# Patient Record
Sex: Male | Born: 1995 | Race: Black or African American | Hispanic: No | Marital: Single | Smoking: Never smoker
Health system: Southern US, Community
[De-identification: ages and names within clinical notes are randomized; demographics above are authoritative.]

## PROBLEM LIST (undated history)

## (undated) DIAGNOSIS — S62339A Displaced fracture of neck of unspecified metacarpal bone, initial encounter for closed fracture: Secondary | ICD-10-CM

## (undated) HISTORY — PX: NO PAST SURGERIES: SHX2092

---
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
## 2014-10-20 ENCOUNTER — Encounter: Payer: Self-pay | Admitting: Family Medicine

## 2014-10-20 ENCOUNTER — Ambulatory Visit (INDEPENDENT_AMBULATORY_CARE_PROVIDER_SITE_OTHER): Payer: 59 | Admitting: Family Medicine

## 2014-10-20 VITALS — BP 118/64 | HR 60 | Temp 98.1°F | Resp 16 | Ht 70.5 in | Wt 135.8 lb

## 2014-10-20 DIAGNOSIS — Z113 Encounter for screening for infections with a predominantly sexual mode of transmission: Secondary | ICD-10-CM

## 2014-10-20 DIAGNOSIS — Z0000 Encounter for general adult medical examination without abnormal findings: Secondary | ICD-10-CM

## 2014-10-20 DIAGNOSIS — Z23 Encounter for immunization: Secondary | ICD-10-CM

## 2014-10-20 DIAGNOSIS — Z114 Encounter for screening for human immunodeficiency virus [HIV]: Secondary | ICD-10-CM

## 2014-10-20 LAB — POCT UA - MICROSCOPIC ONLY
Casts, Ur, LPF, POC: NEGATIVE
Crystals, Ur, HPF, POC: NEGATIVE
Mucus, UA: NEGATIVE
Yeast, UA: NEGATIVE

## 2014-10-20 LAB — POCT URINALYSIS DIPSTICK
Bilirubin, UA: NEGATIVE
Glucose, UA: NEGATIVE
Ketones, UA: NEGATIVE
Nitrite, UA: NEGATIVE
Protein, UA: NEGATIVE
Spec Grav, UA: 1.015
Urobilinogen, UA: 1
pH, UA: 7.5

## 2014-10-20 NOTE — Progress Notes (Signed)
IDENTIFYING INFORMATION  Johnny Jacobs Doxtater / DOB: 06/17/1996 / MRN: [id number]  The patient has RHINITIS, ALLERGIC on his problem list.  SUBJECTIVE  Chief Complaint: Annual Exam   History of present illness: Johnny Jacobs is a 18 y.o. year old male who presents for an annual exam.  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 He is asymptomatic today.    He complains of sleep difficulties and gets 6-7 hours of sleep every 24 hours, however his sleep is broken up.  He denies anhedonia and depressed mood.    He reports that he has started counseling for the stressors that accompany school.      HIV: He has not been screened.  He reports being sexually active.  He prefers women.  He reports that is has been "a while" since he was sexually active.  He reports that he does not always use protection.  He has had 3 partners in the past.  He would like an HIV and syphilis screening today.  He denies urinary symptoms.  He smokes marijuana, and is in the process of quitting.     He  has a past medical history of Allergy and Depression.    He currently has no medications in their medication list.  Johnny Jacobs has No Known Allergies. He  reports that he has never smoked. He does not have any smokeless tobacco history on file. He reports that he drinks alcohol. He reports that he uses illicit drugs about twice per week.  The patient  has no past surgical history on file.  His family history includes Diabetes in his maternal grandmother; Hyperlipidemia in his maternal grandmother; Hypertension in his father and maternal grandmother.  Review of Systems  Constitutional: Negative.   HENT: Negative.   Eyes: Negative.   Respiratory: Negative.   Cardiovascular: Negative.   Gastrointestinal: Negative.   Genitourinary: Negative.   Musculoskeletal: Negative.   Skin: Negative.   Neurological: Negative.   Endo/Heme/Allergies: Negative.   Psychiatric/Behavioral: Negative.      OBJECTIVE  Blood pressure 118/64, pulse 60, temperature 98.1 F (36.7 C), temperature source Oral, resp. rate 16, height 5' 10.5" (1.791 m), weight 135 lb 12.8 oz (61.598 kg), SpO2 99 %. The patient's body mass index is 19.2 kg/(m^2).  Physical Exam  Constitutional: He is oriented to person, place, and time. He appears well-developed and well-nourished.  HENT:  Head: Normocephalic.  Mouth/Throat: No oropharyngeal exudate.  Eyes: Conjunctivae and EOM are normal. Pupils are equal, round, and reactive to light.  Neck: Normal range of motion. No tracheal deviation present.  Cardiovascular: Normal rate, regular rhythm and normal heart sounds.  Exam reveals no gallop and no friction rub.   No murmur heard. Respiratory: Effort normal and breath sounds normal.  GI: Soft. Bowel sounds are normal. Hernia confirmed negative in the right inguinal area and confirmed negative in the left inguinal area.  Genitourinary: Testes normal and penis normal. Right testis shows no mass, no swelling and no tenderness. Left testis shows no mass, no swelling and no tenderness. Circumcised. No hypospadias, penile erythema or penile tenderness. No discharge found.  Musculoskeletal: Normal range of motion.  Lymphadenopathy:    He has no cervical adenopathy.       Right: No inguinal adenopathy present.       Left: No inguinal adenopathy present.  Neurological: He is alert and oriented to person, place, and time. He has normal strength and normal reflexes. No cranial nerve deficit.  Skin: Skin is warm, dry and intact.  Psychiatric: He has a normal mood and affect. His behavior is normal. Judgment and thought content normal.    Results for orders placed or performed in visit on 10/20/14 (from the past 24 hour(s))  POCT UA - Microscopic Only     Status: Abnormal   Collection Time: 10/20/14  3:47 PM  Result Value Ref Range   WBC, Ur, HPF, POC 3-5    RBC, urine, microscopic 2-3    Bacteria, U Microscopic trace     Mucus, UA neg    Epithelial cells, urine per micros 0-1    Crystals, Ur, HPF, POC neg    Casts, Ur, LPF, POC neg    Yeast, UA neg   POCT urinalysis dipstick     Status: Abnormal   Collection Time: 10/20/14  3:47 PM  Result Value Ref Range   Color, UA yellow    Clarity, UA clear    Glucose, UA neg    Bilirubin, UA neg    Ketones, UA neg    Spec Grav, UA 1.015    Blood, UA trace    pH, UA 7.5    Protein, UA neg    Urobilinogen, UA 1.0    Nitrite, UA neg    Leukocytes, UA Trace     ASSESSMENT & PLAN  Rasaan was seen today for annual exam.  Diagnoses and associated orders for this visit:  Annual physical exam - POCT UA - Microscopic Only - POCT urinalysis dipstick  Need for prophylactic vaccination and inoculation against influenza - Flu Vaccine QUAD 36+ mos IM  Screening for STD (sexually transmitted disease) - RPR - GC/Chlamydia Probe Amp  Screening for HIV (human immunodeficiency virus) - HIV antibody     The patient was instructed to to call or comeback to clinic as needed, or should symptoms warrant.  Deliah BostonMichael Maniyah Moller, MHS, PA-C [facility] 10/20/2014 5:21 PM

## 2014-10-21 ENCOUNTER — Telehealth: Payer: Self-pay | Admitting: Physician Assistant

## 2014-10-21 ENCOUNTER — Encounter: Payer: Self-pay | Admitting: Physician Assistant

## 2014-10-21 DIAGNOSIS — A5601 Chlamydial cystitis and urethritis: Secondary | ICD-10-CM

## 2014-10-21 LAB — RPR

## 2014-10-21 LAB — HIV ANTIBODY (ROUTINE TESTING W REFLEX): HIV 1&2 Ab, 4th Generation: NONREACTIVE

## 2014-10-21 LAB — GC/CHLAMYDIA PROBE AMP
CT Probe RNA: POSITIVE — AB
GC Probe RNA: NEGATIVE

## 2014-10-21 MED ORDER — AZITHROMYCIN 500 MG PO TABS: 1000.0000 mg | ORAL_TABLET | Freq: Every day | ORAL | Status: AC

## 2014-10-21 NOTE — Telephone Encounter (Signed)
I have spoken with patient regarding the his negative HIV, Syphilis, and Gonorrhea results.  Informed him of positive Chlamydia, and that he is to take 1 gram of Azithromycin today.  Medication sent to pharmacy.  Information was received well be the patient.  Retest with with urine probe in one month and that lab is ordered. Patient satisfied with plan.      Deliah BostonMichael Clark, MS, PA-C   12:54 PM, 10/21/2014

## 2014-10-24 NOTE — Progress Notes (Signed)
Agree with A/P. Tle

## 2016-01-17 DIAGNOSIS — M545 Low back pain: Secondary | ICD-10-CM | POA: Diagnosis not present

## 2016-01-30 ENCOUNTER — Ambulatory Visit (INDEPENDENT_AMBULATORY_CARE_PROVIDER_SITE_OTHER): Payer: 59 | Admitting: Family Medicine

## 2016-01-30 VITALS — BP 120/77 | HR 57 | Temp 98.1°F | Resp 18 | Ht 71.0 in | Wt 159.0 lb

## 2016-01-30 DIAGNOSIS — S39012A Strain of muscle, fascia and tendon of lower back, initial encounter: Secondary | ICD-10-CM | POA: Diagnosis not present

## 2016-01-30 DIAGNOSIS — Z8619 Personal history of other infectious and parasitic diseases: Secondary | ICD-10-CM

## 2016-01-30 NOTE — Patient Instructions (Signed)
Do gentle range of motion twice a day May take OTC ibuprofen 2 tabs every 8 hours for pain May apply heat as needed Please return to clinic for worsening pain and lost of bowel or bladder control.  Lumbosacral Strain Lumbosacral strain is a strain of any of the parts that make up your lumbosacral vertebrae. Your lumbosacral vertebrae are the bones that make up the lower third of your backbone. Your lumbosacral vertebrae are held together by muscles and tough, fibrous tissue (ligaments).  CAUSES  A sudden blow to your back can cause lumbosacral strain. Also, anything that causes an excessive stretch of the muscles in the low back can cause this strain. This is typically seen when people exert themselves strenuously, fall, lift heavy objects, bend, or crouch repeatedly. RISK FACTORS  Physically demanding work.  Participation in pushing or pulling sports or sports that require a sudden twist of the back (tennis, golf, baseball).  Weight lifting.  Excessive lower back curvature.  Forward-tilted pelvis.  Weak back or abdominal muscles or both.  Tight hamstrings. SIGNS AND SYMPTOMS  Lumbosacral strain may cause pain in the area of your injury or pain that moves (radiates) down your leg.  DIAGNOSIS Your health care provider can often diagnose lumbosacral strain through a physical exam. In some cases, you may need tests such as X-ray exams.  TREATMENT  Treatment for your lower back injury depends on many factors that your clinician will have to evaluate. However, most treatment will include the use of anti-inflammatory medicines. HOME CARE INSTRUCTIONS   Avoid hard physical activities (tennis, racquetball, waterskiing) if you are not in proper physical condition for it. This may aggravate or create problems.  If you have a back problem, avoid sports requiring sudden body movements. Swimming and walking are generally safer activities.  Maintain good posture.  Maintain a healthy  weight.  For acute conditions, you may put ice on the injured area.  Put ice in a plastic bag.  Place a towel between your skin and the bag.  Leave the ice on for 20 minutes, 2-3 times a day.  When the low back starts healing, stretching and strengthening exercises may be recommended. SEEK MEDICAL CARE IF:  Your back pain is getting worse.  You experience severe back pain not relieved with medicines. SEEK IMMEDIATE MEDICAL CARE IF:   You have numbness, tingling, weakness, or problems with the use of your arms or legs.  There is a change in bowel or bladder control.  You have increasing pain in any area of the body, including your belly (abdomen).  You notice shortness of breath, dizziness, or feel faint.  You feel sick to your stomach (nauseous), are throwing up (vomiting), or become sweaty.  You notice discoloration of your toes or legs, or your feet get very cold. MAKE SURE YOU:   Understand these instructions.  Will watch your condition.  Will get help right away if you are not doing well or get worse.   This information is not intended to replace advice given to you by your health care provider. Make sure you discuss any questions you have with your health care provider.   Document Released: 08/27/2005 Document Revised: 12/08/2014 Document Reviewed: 07/06/2013 Elsevier Interactive Patient Education [employer]

## 2016-01-30 NOTE — Progress Notes (Signed)
Subjective:    Patient ID: Johnny Jacobs, male    DOB: 07/13/1996, 20 y.o.   MRN: [id number]  HPI This is pleasant 20 yo male who presents today with back pain. 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 He was given a muscle relaxer (?cyclobenzaprine) with relief of pain. He was stretching and doing well until he over stretched when reaching for something last week and felt a pain is his back. This has been improved with muscle relaxer and he is having rare intermittent pain now. He just wanted to come in today to make sure nothing was wrong. Does not exercise regularly. Has been considering starting yoga.   He has history of positive chlamydia 10/20/14. He never had follow up testing. He declines testing today. He denies urinary symptoms, discharge/rash.   Past Medical History  Diagnosis Date  . Allergy     seasonal  . Depression     per patient college depression  . Chlamydia infection 10/21/2014    Treated with one gram of azithromycin  History reviewed. No pertinent past surgical history. Family History  Problem Relation Age of Onset  . Hypertension Father   . Diabetes Maternal Grandmother   . Hyperlipidemia Maternal Grandmother   . Hypertension Maternal Grandmother    Social History  Substance Use Topics  . Smoking status: Never Smoker   . Smokeless tobacco: None  . Alcohol Use: 0.0 oz/week    0 Standard drinks or equivalent per week     Comment: occasionally      Review of Systems  Constitutional: Negative for fever and fatigue.  Respiratory: Negative for cough and shortness of breath.   Cardiovascular: Negative for chest pain.  Gastrointestinal: Negative for abdominal pain.  Genitourinary: Negative for dysuria, frequency, discharge and genital sores.  Musculoskeletal: Positive for back pain (low back). Negative for neck pain.  Neurological: Negative for weakness, numbness and headaches.       Objective:   Physical Exam  Constitutional: He is oriented to person, place, and time. He appears well-developed and well-nourished.  HENT:  Head: Normocephalic and atraumatic.  Eyes: Conjunctivae and EOM are normal. Pupils are equal, round, and reactive to light.  Neck: Normal range of motion. Neck supple.  Cardiovascular: Normal rate, regular rhythm and normal heart sounds.   Pulmonary/Chest: Effort normal and breath sounds normal.  Musculoskeletal: Normal range of motion.       Cervical back: Normal.       Thoracic back: Normal.       Lumbar back: Normal.  Neurological: He is alert and oriented to person, place, and time. He has normal reflexes.  Skin: Skin is warm and dry.  Psychiatric: He has a normal mood and affect. His behavior is normal. Judgment and thought content normal.  Vitals reviewed.     BP 120/77 mmHg  Pulse 57  Temp(Src) 98.1 F (36.7 C) (Oral)  Resp 18  Ht  (1.803 m)  Wt 159 lb (72.122 kg)  BMI 22.19 kg/m2  SpO2 94% Wt Readings from Last 3 Encounters:  01/30/16 159 lb (72.122 kg)  10/20/14 135 lb 12.8 oz (61.598 kg)  07/16/12 141 lb 6.4 oz (64.139 kg) (37 %*, Z = -0.34)   * Growth percentiles are based on CDC 2-20 Years data.       Assessment & Plan:  1. Lumbar strain, initial encounter -Provided written and verbal information regarding diagnosis and treatment. -  Encouraged stretching, heat, ROM - RTC precautions reviewed  2. History of chlamydia infection - strongly encouraged screening today, patient declined, states he will return for STD testing   Olean Ree, FNP-BC  [facility]  02/01/2016 9:26 AM

## 2016-01-30 NOTE — Progress Notes (Signed)
   Subjective:    Patient ID: Johnny Jacobs, male    DOB: Nov 20, 1996, 20 y.o.   MRN: [id number]  HPI This is a pleasant 20 year old that presents today with sharpe lower back pain x 2 weeks. Patient reached down to pick up a box and pulled his back. He has seen the doctor at [facility] who prescribed some muscle relaxers- patient has had some relief. Patient reports that he was sitting on the bed this week and reached for something and felt a pain that was worst then the first time (10/10)- pt took a muscle relaxer after this event with relief. Concerned today that he has injured something.   Past Medical History  Diagnosis Date  . Allergy     seasonal  . Depression     per patient college depression  . Chlamydia infection 10/21/2014    Treated with one gram of azithromycin   Family History  Problem Relation Age of Onset  . Hypertension Father   . Diabetes Maternal Grandmother   . Hyperlipidemia Maternal Grandmother   . Hypertension Maternal Grandmother    Social History   Social History  . Marital Status: Single    Spouse Name: N/A  . Number of Children: 0  . Years of Education: 16   Occupational History  . Student [employer]   Social History Main Topics  . Smoking status: Never Smoker   . Smokeless tobacco: Not on file  . Alcohol Use: 0.0 oz/week    0 Standard drinks or equivalent per week     Comment: occasionally  . Drug Use: 2.00 per week     Comment: marijuana  . Sexual Activity:    Partners: Female     Comment: 3 partners as of 10/21/2014   Other Topics Concern  . Not on file   Social History Narrative    Review of Systems  Constitutional: Negative for fever, activity change and appetite change.  HENT: Negative for congestion.   Respiratory: Negative for cough, chest tightness and shortness of breath.   Cardiovascular: Negative for chest pain.  Gastrointestinal: Negative for abdominal pain.  Musculoskeletal: Positive for back pain (x 2  weeks).  Neurological: Negative for dizziness.       Objective:   Physical Exam  Constitutional: He is oriented to person, place, and time. He appears well-developed and well-nourished.  HENT:  Head: Normocephalic.  Neck: Normal range of motion.  Cardiovascular: Normal rate, regular rhythm and normal heart sounds.   Pulmonary/Chest: Effort normal and breath sounds normal. No respiratory distress. He has no wheezes. He exhibits no tenderness.  Musculoskeletal: Normal range of motion.  Neurological: He is alert and oriented to person, place, and time.  Skin: Skin is warm and dry.  Psychiatric: He has a normal mood and affect. His behavior is normal. Judgment and thought content normal.     BP 120/77 mmHg  Pulse 57  Temp(Src) 98.1 F (36.7 C) (Oral)  Resp 18  Ht  (1.803 m)  Wt 159 lb (72.122 kg)  BMI 22.19 kg/m2  SpO2 94%      Assessment & Plan:

## 2016-02-01 ENCOUNTER — Encounter: Payer: Self-pay | Admitting: Family Medicine

## 2017-03-01 DIAGNOSIS — S62339A Displaced fracture of neck of unspecified metacarpal bone, initial encounter for closed fracture: Secondary | ICD-10-CM

## 2017-03-01 HISTORY — DX: Displaced fracture of neck of unspecified metacarpal bone, initial encounter for closed fracture: S62.339A

## 2017-03-02 ENCOUNTER — Ambulatory Visit (INDEPENDENT_AMBULATORY_CARE_PROVIDER_SITE_OTHER): Payer: 59

## 2017-03-02 ENCOUNTER — Ambulatory Visit (INDEPENDENT_AMBULATORY_CARE_PROVIDER_SITE_OTHER): Payer: 59 | Admitting: Physician Assistant

## 2017-03-02 VITALS — BP 142/92 | HR 64 | Temp 99.0°F | Resp 16 | Ht 71.0 in | Wt 165.0 lb

## 2017-03-02 DIAGNOSIS — M79631 Pain in right forearm: Secondary | ICD-10-CM

## 2017-03-02 DIAGNOSIS — M25531 Pain in right wrist: Secondary | ICD-10-CM | POA: Diagnosis not present

## 2017-03-02 DIAGNOSIS — S62306A Unspecified fracture of fifth metacarpal bone, right hand, initial encounter for closed fracture: Secondary | ICD-10-CM | POA: Diagnosis not present

## 2017-03-02 DIAGNOSIS — M79641 Pain in right hand: Secondary | ICD-10-CM | POA: Diagnosis not present

## 2017-03-02 DIAGNOSIS — S62336A Displaced fracture of neck of fifth metacarpal bone, right hand, initial encounter for closed fracture: Secondary | ICD-10-CM | POA: Diagnosis not present

## 2017-03-02 MED ORDER — HYDROCODONE-ACETAMINOPHEN 5-325 MG PO TABS: 1.0000 | ORAL_TABLET | Freq: Three times a day (TID) | ORAL | 0 refills | Status: DC | PRN

## 2017-03-02 NOTE — Patient Instructions (Addendum)
Keep arm immobilized until you see hand surgery. They should call you in the next couple of days for your appointment. If you do not hear from ortho in two days, please contact our office immediately.  When you shower, use a plastic bag to keep the splint dry.   For pain, you may take  of tylenol with ibuprofen 400-600mg  every 6 hours for pain and inflammation. For breakthrough pain, use hydrocodone 1 tablet every 8 hours as needed.     Boxer's Fracture A boxer's fracture is a break (fracture) of the bone in your hand that connects your little finger to your wrist (fifth metacarpal). This type of fracture usually happens at the end of the bone, closest to the little finger. The knuckle is often pushed down by the impact. In some cases, only a splint or brace is needed, or you may need a cast. Casting or splinting may include taping your injured finger to the next finger (buddy taping). You may need surgery to repair the fracture. This may involve the use of wires, screws, or plates to hold the bone pieces in place. What are the causes? This injury may be caused by:  Hitting an object with a clenched fist.  A hard, direct hit to the hand.  An injury that crushes the hand. What increases the risk? This injury is more likely to occur if:  You are in a fistfight.  You have certain bone diseases. What are the signs or symptoms? Symptoms of this type of fracture develop soon after the injury. Symptoms may include:  Swelling of the hand.  Pain.  Pain when moving the fifth finger or touching the hand.  Abnormal position of the finger.  Not being able to move the finger.  A shortened finger.  A finger knuckle that looks sunken in. How is this diagnosed? This injury may be diagnosed based on your symptoms, especially if you had a recent hand injury. Your health care provider will perform a physical exam, and you may also have X-rays to confirm the diagnosis. How is this  treated? Treatment for this injury depends on how severe it is. Possible treatments include:  Closed reduction. If your bone is stable and can be moved back into place, you may only need to wear a cast or splint or have buddy taping.  Open reduction with internal fixation (ORIF). This may be needed if your fracture is far out of place or goes through the joint surface of the bone. This treatment involves open surgery to move your bones back into the right position. Screws, wires, or plates may be used to stabilize the fracture. You may need to wear a cast or a splint for several weeks. You will also need to have follow-up X-rays to make sure that the bone is healing well and staying in position. After you no longer need the cast or splint, you may need physical therapy. This will help you to regain full movement and strength in your hand. Follow these instructions at home: If you have a cast:   Do not stick anything inside the cast to scratch your skin. Doing that increases your risk of infection.  Check the skin around the cast every day. Report any concerns to your health care provider. You may put lotion on dry skin around the edges of the cast. Do not apply lotion to the skin underneath the cast. If you have a splint:   Wear it as directed by your health care provider.  Remove it only as directed by your health care provider.  Loosen the splint if your fingers become numb and tingle, or if they turn cold and blue. Bathing   Cover the cast or splint with a watertight plastic bag to protect it from water while you take a bath or a shower. Do not let the cast or splint get wet. Managing pain, stiffness, and swelling   If directed, apply ice to the injured area (if you have a splint, not a cast):  Put ice in a plastic bag.  Place a towel between your skin and the bag.  Leave the ice on for 20 minutes, 2-3 times a day.  Move your fingers often to avoid stiffness and to lessen  swelling.  Raise the injured area above the level of your heart while you are sitting or lying down. Driving   Do not drive or operate heavy machinery while taking pain medicine.  Do not drive while wearing a cast or splint on a hand or foot that you use for driving. Activity   Return to your normal activities as directed by your health care provider. Ask your health care provider what activities are safe for you. General instructions   Do not put pressure on any part of the cast or splint until it is fully hardened. This may take several hours.  Keep the cast or splint clean and dry.  Do not use any tobacco products, including cigarettes, chewing tobacco, or electronic cigarettes. Tobacco can delay bone healing. If you need help quitting, ask your health care provider.  Take medicines only as directed by your health care provider.  Keep all follow-up visits as directed by your health care provider. This is important. Contact a health care provider if:  Your pain is getting worse.  You have redness, swelling, or pain in the injured area.  You have fluid, blood, or pus coming from under your cast or splint.  You notice a bad smell coming from under your cast or splint.  You have a fever.  Your cast or splint feels too tight or too loose.  You cast is coming apart. Get help right away if:  You develop a rash.  You have trouble breathing.  Your skin or nails on your injured hand turn blue or gray even after you loosen your splint.  Your injured hand feels cold or becomes numb even after you loosen your splint.  You develop severe pain under the cast or in your hand. This information is not intended to replace advice given to you by your health care provider. Make sure you discuss any questions you have with your health care provider. Document Released: 11/17/2005 Document Revised: 04/24/2016 Document Reviewed: 09/06/2014 Elsevier Interactive Patient Education  2017  [employer]    Scaphoid Fracture A scaphoid fracture is a break in one of the small bones of the wrist. The scaphoid bone is located on the thumbside of the wrist. Itsupports the other seven bones that make up the wrist. The scaphoid bone has a poor blood supply, so it can take a long time to heal. You may need to wear a cast or splint for several months. What are the causes? This injury is usually caused by a fall onto an outstretched hand and arm. This type of injury may also occur if you are in a motor vehicle collisionand you brace yourself with your hand. What increases the risk? The following factors may make you more likely to develop this  injury:  Playingcontact sports.  Skiing, skating, or rollerblading. What are the signs or symptoms? Symptoms of this injury include:  Pain, especially when grasping or pinching with your thumb.  Pain when pressing on the base of your thumb, especially in the hollow area at the base of your thumb when your thumb is extended outward.  Swelling.  Bruising. How is this diagnosed? This injury may be diagnosed based on:  Your history of injury.  A physical exam of your wrist and thumb.  X-rays.  CT scan or MRI. These tests are sometimes needed because this type of fracture may not show up on X-rays. A scaphoid fracture may be hard to diagnose because pain may not start for a few days. Also, the fracture does not cause a deformity, and it may not limit movement. How is this treated? Treatment depends on the location of the fracture and whether the bone is out of place (displaced). Treatment may be surgical or nonsurgical:  You may need a cast or splint from the middle of your forearm down to your wrist. Yourthumb may be extended out and included in the cast or splint.  While your fracture is healing, it may be treated with sound waves or electricalenergy to stimulate healing.  A displaced fracture may require surgery to put the  pieces of bone back in proper position. Screws or wires may be used to hold the bone in place.  You may need to do exercises (physical therapy) to restore wrist movement after your cast or splint is removed. Follow these instructions at home: If you have a cast:   Do not stick anything inside the cast to scratch your skin. Doing that increases your risk of infection.  Check the skin around the cast every day. Report any concerns to your health care provider. You may put lotion on dry skin around the edges of the cast. Do not apply lotion to the skin underneath the cast.  Do not let your cast get wet if it is not waterproof.  Keep the cast clean. If you have a splint:   Wear the splint as told by your health care provider. Remove it only as told by your health care provider.  Loosen the splint if your fingers tingle, become numb, or turn cold and blue.  Do not let your splint get wet if it is not waterproof.  Keep the splint clean. Bathing   Do not take baths, swim, or use a hot tub until your health care provider approves. Ask your health care provider if you can take showers. You may only be allowed to take sponge baths for bathing.  If your cast or splint is not waterproof, cover it with a watertight plastic bag when you take a bath or a shower. Managing pain, stiffness, and swelling   If directed, apply ice to the injured area.  Put ice in a plastic bag.  Place a towel between your skin and the bag.  Leave the ice on for 20 minutes, 2-3 times per day.  Move your fingers often to avoid stiffness and to lessen swelling.  Raise (elevate) the injured area above the level of your heart while you are sitting or lying down. Driving   Do not drive or operate heavy machinery while taking prescription pain medicine.  Ask your health care provider when it is safe to drive if you have a cast or splint on a hand that you use for driving. Activity   Return to your  normal  activities as told by your health care provider. Ask your health care provider what activities are safe for you. You may need to limit activities such as contact sports, throwing, pushing, climbing, and usingvibrating machinery.  Do not lift anything that is heavier than 1 lb (0.5 kg) with the affected hand until your health care provider tells you that it is safe.  Do exercises only as told by your health care provider. General instructions   Do not put pressure on any part of the cast or splint until it is fully hardened. This may take several hours.  Take over-the-counter and prescription medicines only as told by your health care provider.  Do not use any tobacco products, including cigarettes, chewing tobacco, or e-cigarettes. Tobacco can delay bone healing. If you need help quitting, ask your health care provider.  Keep all follow-up visits as told by your health care provider. This is important. Contact a health care provider if:  Your pain or swelling gets worse even though you have had treatment.  You have pain, numbness, or coldness in your hand or fingers.  Your cast or splint becomes loose or damaged. Get help right away if:  You lose feeling in your hand or fingers.  Your fingers or fingernails turn pale or blue. This information is not intended to replace advice given to you by your health care provider. Make sure you discuss any questions you have with your health care provider. Document Released: 11/07/2002 Document Revised: 04/24/2016 Document Reviewed: 05/30/2015 Elsevier Interactive Patient Education  2017 [employer].  IF you received an x-ray today, you will receive an invoice from [facility]. Please contact [facility] at [phone] with questions or concerns regarding your invoice.   IF you received labwork today, you will receive an invoice from [facility]. Please contact [facility] at [phone] with questions or concerns regarding your  invoice.   Our billing staff will not be able to assist you with questions regarding bills from these companies.  You will be contacted with the lab results as soon as they are available. The fastest way to get your results is to activate your My Chart account. Instructions are located on the last page of this paperwork. If you have not heard from [place] regarding the results in 2 weeks, please contact this office.
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
## 2017-03-05 ENCOUNTER — Encounter (HOSPITAL_BASED_OUTPATIENT_CLINIC_OR_DEPARTMENT_OTHER): Payer: Self-pay | Admitting: *Deleted

## 2017-03-05 ENCOUNTER — Other Ambulatory Visit: Payer: Self-pay | Admitting: Orthopedic Surgery

## 2017-03-05 DIAGNOSIS — S62336A Displaced fracture of neck of fifth metacarpal bone, right hand, initial encounter for closed fracture: Secondary | ICD-10-CM | POA: Diagnosis not present

## 2017-03-06 ENCOUNTER — Encounter (HOSPITAL_BASED_OUTPATIENT_CLINIC_OR_DEPARTMENT_OTHER)
Admission: RE | Disposition: A | Discharge status: Home / Self Care | Payer: Self-pay | Source: Ambulatory Visit | Attending: Orthopedic Surgery

## 2017-03-06 ENCOUNTER — Encounter (HOSPITAL_BASED_OUTPATIENT_CLINIC_OR_DEPARTMENT_OTHER): Payer: Self-pay | Admitting: *Deleted

## 2017-03-06 ENCOUNTER — Ambulatory Visit (HOSPITAL_BASED_OUTPATIENT_CLINIC_OR_DEPARTMENT_OTHER): Payer: 59 | Admitting: Certified Registered"

## 2017-03-06 ENCOUNTER — Ambulatory Visit (HOSPITAL_BASED_OUTPATIENT_CLINIC_OR_DEPARTMENT_OTHER)
Admission: RE | Admit: 2017-03-06 | Discharge: 2017-03-06 | Disposition: A | Discharge status: Home / Self Care | Payer: 59 | Source: Ambulatory Visit | Attending: Orthopedic Surgery | Admitting: Orthopedic Surgery

## 2017-03-06 DIAGNOSIS — S62396A Other fracture of fifth metacarpal bone, right hand, initial encounter for closed fracture: Secondary | ICD-10-CM | POA: Insufficient documentation

## 2017-03-06 DIAGNOSIS — S62336A Displaced fracture of neck of fifth metacarpal bone, right hand, initial encounter for closed fracture: Secondary | ICD-10-CM | POA: Diagnosis not present

## 2017-03-06 DIAGNOSIS — X58XXXA Exposure to other specified factors, initial encounter: Secondary | ICD-10-CM | POA: Insufficient documentation

## 2017-03-06 DIAGNOSIS — M79641 Pain in right hand: Secondary | ICD-10-CM | POA: Diagnosis present

## 2017-03-06 HISTORY — DX: Displaced fracture of neck of unspecified metacarpal bone, initial encounter for closed fracture: S62.339A

## 2017-03-06 HISTORY — PX: CLOSED REDUCTION FINGER WITH PERCUTANEOUS PINNING: SHX5612

## 2017-03-06 SURGERY — CLOSED REDUCTION, FINGER, WITH PERCUTANEOUS PINNING
Anesthesia: General | Site: Hand | Laterality: Right

## 2017-03-06 MED ORDER — CHLORHEXIDINE GLUCONATE 4 % EX LIQD: 60.0000 mL | Freq: Once | CUTANEOUS | Status: DC

## 2017-03-06 MED ORDER — CEFAZOLIN SODIUM-DEXTROSE 2-4 GM/100ML-% IV SOLN
INTRAVENOUS | Status: AC
  Filled 2017-03-06: qty 100

## 2017-03-06 MED ORDER — OXYCODONE HCL 5 MG PO TABS
ORAL_TABLET | ORAL | Status: AC
  Filled 2017-03-06: qty 1

## 2017-03-06 MED ORDER — OXYCODONE HCL 5 MG/5ML PO SOLN: 5.0000 mg | Freq: Once | ORAL | Status: AC | PRN

## 2017-03-06 MED ORDER — FENTANYL CITRATE (PF) 100 MCG/2ML IJ SOLN
INTRAMUSCULAR | Status: AC
  Filled 2017-03-06: qty 2

## 2017-03-06 MED ORDER — BUPIVACAINE HCL (PF) 0.25 % IJ SOLN
INTRAMUSCULAR | Status: DC | PRN
  Administered 2017-03-06: 4 mL

## 2017-03-06 MED ORDER — CEFAZOLIN SODIUM-DEXTROSE 2-4 GM/100ML-% IV SOLN
2.0000 g | INTRAVENOUS | Status: AC
  Administered 2017-03-06: 2 g via INTRAVENOUS

## 2017-03-06 MED ORDER — FENTANYL CITRATE (PF) 100 MCG/2ML IJ SOLN
50.0000 ug | INTRAMUSCULAR | Status: AC | PRN
  Administered 2017-03-06 (×3): 50 ug via INTRAVENOUS

## 2017-03-06 MED ORDER — DEXAMETHASONE SODIUM PHOSPHATE 10 MG/ML IJ SOLN
INTRAMUSCULAR | Status: DC | PRN
  Administered 2017-03-06: 10 mg via INTRAVENOUS

## 2017-03-06 MED ORDER — LACTATED RINGERS IV SOLN
INTRAVENOUS | Status: DC
  Administered 2017-03-06: 11:00:00 via INTRAVENOUS

## 2017-03-06 MED ORDER — OXYCODONE HCL 5 MG PO TABS
5.0000 mg | ORAL_TABLET | Freq: Once | ORAL | Status: AC | PRN
  Administered 2017-03-06: 5 mg via ORAL

## 2017-03-06 MED ORDER — LIDOCAINE HCL (CARDIAC) 20 MG/ML IV SOLN
INTRAVENOUS | Status: DC | PRN
  Administered 2017-03-06: 60 mg via INTRAVENOUS

## 2017-03-06 MED ORDER — HYDROMORPHONE HCL 1 MG/ML IJ SOLN
INTRAMUSCULAR | Status: AC
  Filled 2017-03-06: qty 1

## 2017-03-06 MED ORDER — ONDANSETRON HCL 4 MG/2ML IJ SOLN
INTRAMUSCULAR | Status: DC | PRN
  Administered 2017-03-06: 4 mg via INTRAVENOUS

## 2017-03-06 MED ORDER — SCOPOLAMINE 1 MG/3DAYS TD PT72: 1.0000 | MEDICATED_PATCH | Freq: Once | TRANSDERMAL | Status: DC | PRN

## 2017-03-06 MED ORDER — PROPOFOL 10 MG/ML IV BOLUS
INTRAVENOUS | Status: DC | PRN
  Administered 2017-03-06: 200 mg via INTRAVENOUS

## 2017-03-06 MED ORDER — MEPERIDINE HCL 25 MG/ML IJ SOLN: 6.2500 mg | INTRAMUSCULAR | Status: DC | PRN

## 2017-03-06 MED ORDER — MIDAZOLAM HCL 2 MG/2ML IJ SOLN
1.0000 mg | INTRAMUSCULAR | Status: DC | PRN
  Administered 2017-03-06: 2 mg via INTRAVENOUS

## 2017-03-06 MED ORDER — MIDAZOLAM HCL 2 MG/2ML IJ SOLN
INTRAMUSCULAR | Status: AC
  Filled 2017-03-06: qty 2

## 2017-03-06 MED ORDER — PROMETHAZINE HCL 25 MG/ML IJ SOLN: 6.2500 mg | INTRAMUSCULAR | Status: DC | PRN

## 2017-03-06 MED ORDER — ONDANSETRON HCL 4 MG/2ML IJ SOLN
INTRAMUSCULAR | Status: AC
  Filled 2017-03-06: qty 2

## 2017-03-06 MED ORDER — HYDROMORPHONE HCL 1 MG/ML IJ SOLN
0.2500 mg | INTRAMUSCULAR | Status: DC | PRN
  Administered 2017-03-06 (×2): 0.5 mg via INTRAVENOUS

## 2017-03-06 MED FILL — HYDROCODON-APAP 5-325: 5-325 | 5 days supply | Qty: 15 | Fill #0

## 2017-03-06 SURGICAL SUPPLY — 66 items
APL SKNCLS STERI-STRIP NONHPOA (GAUZE/BANDAGES/DRESSINGS)
BANDAGE ACE 3X5.8 VEL STRL LF (GAUZE/BANDAGES/DRESSINGS) ×3 IMPLANT
BANDAGE ACE 4X5 VEL STRL LF (GAUZE/BANDAGES/DRESSINGS) ×2 IMPLANT
BENZOIN TINCTURE PRP APPL 2/3 (GAUZE/BANDAGES/DRESSINGS) IMPLANT
BLADE SURG 15 STRL LF DISP TIS (BLADE) ×1 IMPLANT
BLADE SURG 15 STRL SS (BLADE) ×3
BNDG CMPR 9X4 STRL LF SNTH (GAUZE/BANDAGES/DRESSINGS)
BNDG COHESIVE 1X5 TAN STRL LF (GAUZE/BANDAGES/DRESSINGS) IMPLANT
BNDG ELASTIC 2X5.8 VLCR STR LF (GAUZE/BANDAGES/DRESSINGS) IMPLANT
BNDG ESMARK 4X9 LF (GAUZE/BANDAGES/DRESSINGS) IMPLANT
BNDG GAUZE ELAST 4 BULKY (GAUZE/BANDAGES/DRESSINGS) ×2 IMPLANT
CANISTER SUCT 1200ML W/VALVE (MISCELLANEOUS) IMPLANT
CLOSURE WOUND 1/2 X4 (GAUZE/BANDAGES/DRESSINGS)
CORDS BIPOLAR (ELECTRODE) IMPLANT
COVER BACK TABLE 60X90IN (DRAPES) ×3 IMPLANT
CUFF TOURNIQUET SINGLE 18IN (TOURNIQUET CUFF) IMPLANT
DECANTER SPIKE VIAL GLASS SM (MISCELLANEOUS) IMPLANT
DRAPE EXTREMITY T 121X128X90 (DRAPE) ×3 IMPLANT
DRAPE OEC MINIVIEW 54X84 (DRAPES) ×3 IMPLANT
DRAPE SURG 17X23 STRL (DRAPES) ×3 IMPLANT
DURAPREP 26ML APPLICATOR (WOUND CARE) ×3 IMPLANT
GAUZE SPONGE 4X4 12PLY STRL (GAUZE/BANDAGES/DRESSINGS) ×3 IMPLANT
GAUZE SPONGE 4X4 16PLY XRAY LF (GAUZE/BANDAGES/DRESSINGS) IMPLANT
GAUZE XEROFORM 1X8 LF (GAUZE/BANDAGES/DRESSINGS) ×2 IMPLANT
GLOVE BIO SURGEON STRL SZ 6.5 (GLOVE) ×1 IMPLANT
GLOVE BIO SURGEONS STRL SZ 6.5 (GLOVE) ×1
GLOVE BIOGEL PI IND STRL 7.0 (GLOVE) IMPLANT
GLOVE BIOGEL PI INDICATOR 7.0 (GLOVE) ×2
GLOVE SURG SYN 8.0 (GLOVE) ×3 IMPLANT
GLOVE SURG SYN 8.0 PF PI (GLOVE) ×2 IMPLANT
GOWN STRL REUS W/ TWL LRG LVL3 (GOWN DISPOSABLE) ×1 IMPLANT
GOWN STRL REUS W/TWL LRG LVL3 (GOWN DISPOSABLE) ×3
GOWN STRL REUS W/TWL XL LVL3 (GOWN DISPOSABLE) ×4 IMPLANT
K-WIRE .045X4 (WIRE) ×4 IMPLANT
NDL HYPO 25X1 1.5 SAFETY (NEEDLE) IMPLANT
NEEDLE HYPO 25X1 1.5 SAFETY (NEEDLE) ×3 IMPLANT
NS IRRIG 1000ML POUR BTL (IV SOLUTION) IMPLANT
PACK BASIN DAY SURGERY FS (CUSTOM PROCEDURE TRAY) ×3 IMPLANT
PAD CAST 3X4 CTTN HI CHSV (CAST SUPPLIES) ×1 IMPLANT
PAD CAST 4YDX4 CTTN HI CHSV (CAST SUPPLIES) IMPLANT
PADDING CAST ABS 4INX4YD NS (CAST SUPPLIES) ×2
PADDING CAST ABS COTTON 4X4 ST (CAST SUPPLIES) ×1 IMPLANT
PADDING CAST COTTON 3X4 STRL (CAST SUPPLIES) ×3
PADDING CAST COTTON 4X4 STRL (CAST SUPPLIES)
PADDING UNDERCAST 2 STRL (CAST SUPPLIES) ×2
PADDING UNDERCAST 2X4 STRL (CAST SUPPLIES) ×1 IMPLANT
SHEET MEDIUM DRAPE 40X70 STRL (DRAPES) ×3 IMPLANT
SPLINT PLASTER CAST XFAST 4X15 (CAST SUPPLIES) IMPLANT
SPLINT PLASTER XTRA FAST SET 4 (CAST SUPPLIES)
STOCKINETTE 4X48 STRL (DRAPES) ×3 IMPLANT
STRIP CLOSURE SKIN 1/2X4 (GAUZE/BANDAGES/DRESSINGS) IMPLANT
SUCTION FRAZIER HANDLE 10FR (MISCELLANEOUS)
SUCTION TUBE FRAZIER 10FR DISP (MISCELLANEOUS) IMPLANT
SUT ETHILON 4 0 PS 2 18 (SUTURE) IMPLANT
SUT ETHILON 5 0 PS 2 18 (SUTURE) IMPLANT
SUT MERSILENE 4 0 P 3 (SUTURE) IMPLANT
SUT VIC AB 4-0 P-3 18XBRD (SUTURE) IMPLANT
SUT VIC AB 4-0 P3 18 (SUTURE)
SUT VICRYL RAPIDE 4-0 (SUTURE) IMPLANT
SUT VICRYL RAPIDE 4/0 PS 2 (SUTURE) IMPLANT
SYR 10ML LL (SYRINGE) IMPLANT
SYR BULB 3OZ (MISCELLANEOUS) IMPLANT
TOWEL OR 17X24 6PK STRL BLUE (TOWEL DISPOSABLE) ×3 IMPLANT
TUBE CONNECTING 20'X1/4 (TUBING)
TUBE CONNECTING 20X1/4 (TUBING) IMPLANT
UNDERPAD 30X30 (UNDERPADS AND DIAPERS) ×3 IMPLANT

## 2017-03-06 NOTE — Discharge Instructions (Signed)

## 2017-03-06 NOTE — Op Note (Signed)
Please see dictated note [id number]

## 2017-03-06 NOTE — Anesthesia Procedure Notes (Signed)
Procedure Name: LMA Insertion Date/Time: 03/06/2017 11:05 AM Performed by: Nakhi Choi D Pre-anesthesia Checklist: Patient identified, Emergency Drugs available, Suction available and Patient being monitored Patient Re-evaluated:Patient Re-evaluated prior to inductionOxygen Delivery Method: Circle system utilized Preoxygenation: Pre-oxygenation with 100% oxygen Intubation Type: IV induction Ventilation: Mask ventilation without difficulty LMA: LMA inserted LMA Size: 4.0 Number of attempts: 1 Airway Equipment and Method: Bite block Placement Confirmation: positive ETCO2 Tube secured with: Tape Dental Injury: Teeth and Oropharynx as per pre-operative assessment

## 2017-03-06 NOTE — Op Note (Signed)
Johnny Jacobs, Johnny Jacobs            ACCOUNT NO.:  [id number]  MEDICAL RECORD NO.:  [id number]  LOCATION:                                 FACILITY:  PHYSICIAN:  Artist Pais. Mina Marble, M.D.   DATE OF BIRTH:  DATE OF PROCEDURE:  03/06/2017 DATE OF DISCHARGE:                              OPERATIVE REPORT   PREOPERATIVE DIAGNOSIS:  Right small metacarpal head and neck junction fracture with significant displacement and rotation.  POSTOPERATIVE DIAGNOSIS:  Right small metacarpal head and neck junction fracture with significant displacement and rotation.  PROCEDURE:  Closed reduction, percutaneous pinning above with 0.045 K- wires x2.  SURGEON:  Artist Pais. Mina Marble, M.D.  ASSISTANT:  None.  ANESTHESIA:  General.  TOURNIQUET:  None.  COMPLICATION:  None.  DRAINS:  None.  DESCRIPTION OF PROCEDURE:  The patient was taken to the operating suite after induction of adequate general anesthetic.  Right upper extremity was prepped and draped in sterile fashion.  An Esmarch was used to exsanguinate the limb.  Tourniquet was then inflated to 250 mmHg.  At this point in time, a Jahss maneuver was used to reduce a displaced and rotated right small metacarpal head junction fracture.  This was done under fluoroscopic imaging.  Once the reduction was obtained and maintained, we placed two 0.045 K-wires from the ulnar border of the hand through the small metacarpal into the ring metacarpal to maintain fracture reduction.  Intraoperative fluoroscopy, AP, lateral, obliques show maintenance of the fracture reduction.  The K- wires were cut outside the skin.  They were bent upon themselves.  They were then dressed with Xeroform, 4x4s, and an ulnar gutter splint.  The patient tolerated this procedure well, went to recovery room in stable fashion.     Artist Pais Mina Marble, M.D.   ______________________________ Artist Pais. Mina Marble, M.D.    MAW/MEDQ  D:  03/06/2017  T:  03/06/2017  Job:   [id number]

## 2017-03-06 NOTE — Anesthesia Preprocedure Evaluation (Signed)
Anesthesia Evaluation  Patient identified by MRN, date of birth, ID band Patient awake    Reviewed: Allergy & Precautions, NPO status , Patient's Chart, lab work & pertinent test results  Airway Mallampati: II  TM Distance: >3 FB Neck ROM: Full    Dental no notable dental hx.    Pulmonary neg pulmonary ROS,    Pulmonary exam normal breath sounds clear to auscultation       Cardiovascular negative cardio ROS Normal cardiovascular exam Rhythm:Regular Rate:Normal     Neuro/Psych negative neurological ROS  negative psych ROS   GI/Hepatic negative GI ROS, Neg liver ROS,   Endo/Other  negative endocrine ROS  Renal/GU negative Renal ROS  negative genitourinary   Musculoskeletal negative musculoskeletal ROS (+)   Abdominal   Peds negative pediatric ROS (+)  Hematology negative hematology ROS (+)   Anesthesia Other Findings   Reproductive/Obstetrics negative OB ROS                             Anesthesia Physical Anesthesia Plan  ASA: I  Anesthesia Plan: General   Post-op Pain Management:    Induction: Intravenous  Airway Management Planned: LMA  Additional Equipment:   Intra-op Plan:   Post-operative Plan: Extubation in OR  Informed Consent: I have reviewed the patients History and Physical, chart, labs and discussed the procedure including the risks, benefits and alternatives for the proposed anesthesia with the patient or authorized representative who has indicated his/her understanding and acceptance.   Dental advisory given  Plan Discussed with: CRNA  Anesthesia Plan Comments:         Anesthesia Quick Evaluation  
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
## 2017-03-06 NOTE — Transfer of Care (Signed)
Immediate Anesthesia Transfer of Care Note  Patient: Johnny Jacobs  Procedure(s) Performed: Procedure(s): CLOSED REDUCTION FINGER WITH PERCUTANEOUS PINNING RIGHT SMALL METACARPAL FRACTURE (Right)  Patient Location: PACU  Anesthesia Type:General  Level of Consciousness: awake and patient cooperative  Airway & Oxygen Therapy: Patient Spontanous Breathing and Patient connected to face mask oxygen  Post-op Assessment: Report given to RN and Post -op Vital signs reviewed and stable  Post vital signs: Reviewed and stable  Last Vitals:  Vitals:   03/06/17 1031  BP: 139/66  Pulse: 69  Resp: 18  Temp: 36.9 C    Last Pain:  Vitals:   03/06/17 1031  TempSrc: Oral         Complications: No apparent anesthesia complications

## 2017-03-06 NOTE — Anesthesia Postprocedure Evaluation (Signed)
Anesthesia Post Note  Patient: Johnny Jacobs  Procedure(s) Performed: Procedure(s) (LRB): CLOSED REDUCTION FINGER WITH PERCUTANEOUS PINNING RIGHT SMALL METACARPAL FRACTURE (Right)  Patient location during evaluation: PACU Anesthesia Type: General Level of consciousness: awake and alert Pain management: pain level controlled Vital Signs Assessment: post-procedure vital signs reviewed and stable Respiratory status: spontaneous breathing, nonlabored ventilation and respiratory function stable Cardiovascular status: blood pressure returned to baseline and stable Postop Assessment: no signs of nausea or vomiting Anesthetic complications: no       Last Vitals:  Vitals:   03/06/17 1133 03/06/17 1134  BP:    Pulse: (!) 55 (!) 55  Resp: 16 16  Temp:      Last Pain:  Vitals:   03/06/17 1215  TempSrc:   PainSc: 5                  Lowella Curb

## 2017-03-09 ENCOUNTER — Encounter (HOSPITAL_BASED_OUTPATIENT_CLINIC_OR_DEPARTMENT_OTHER): Payer: Self-pay | Admitting: Orthopedic Surgery

## 2017-03-10 DIAGNOSIS — M7989 Other specified soft tissue disorders: Secondary | ICD-10-CM | POA: Diagnosis not present

## 2017-03-10 DIAGNOSIS — S62336D Displaced fracture of neck of fifth metacarpal bone, right hand, subsequent encounter for fracture with routine healing: Secondary | ICD-10-CM | POA: Diagnosis not present

## 2017-03-24 DIAGNOSIS — S62336D Displaced fracture of neck of fifth metacarpal bone, right hand, subsequent encounter for fracture with routine healing: Secondary | ICD-10-CM | POA: Diagnosis not present

## 2017-04-02 MED FILL — SULFAMETHOXAZOLE/TMP DS TAB: 800-160 | 7 days supply | Qty: 14 | Fill #0

## 2017-04-13 DIAGNOSIS — S62336D Displaced fracture of neck of fifth metacarpal bone, right hand, subsequent encounter for fracture with routine healing: Secondary | ICD-10-CM | POA: Diagnosis not present

## 2017-04-28 DIAGNOSIS — S62336D Displaced fracture of neck of fifth metacarpal bone, right hand, subsequent encounter for fracture with routine healing: Secondary | ICD-10-CM | POA: Diagnosis not present

## 2019-10-07 ENCOUNTER — Ambulatory Visit (HOSPITAL_COMMUNITY)
Admission: EM | Admit: 2019-10-07 | Discharge: 2019-10-07 | Disposition: A | Discharge status: Home / Self Care | Payer: Self-pay | Attending: Family Medicine | Admitting: Family Medicine

## 2019-10-07 ENCOUNTER — Other Ambulatory Visit: Payer: Self-pay

## 2019-10-07 ENCOUNTER — Encounter (HOSPITAL_COMMUNITY): Payer: Self-pay

## 2019-10-07 DIAGNOSIS — M545 Low back pain, unspecified: Secondary | ICD-10-CM

## 2019-10-07 DIAGNOSIS — S39012A Strain of muscle, fascia and tendon of lower back, initial encounter: Secondary | ICD-10-CM

## 2019-10-07 MED ORDER — NAPROXEN 500 MG PO TABS: 500.0000 mg | ORAL_TABLET | Freq: Two times a day (BID) | ORAL | 0 refills | Status: AC | PRN

## 2019-10-07 MED ORDER — CYCLOBENZAPRINE HCL 10 MG PO TABS: ORAL_TABLET | ORAL | 0 refills | Status: AC

## 2019-10-07 NOTE — ED Provider Notes (Signed)
[facility]    CSN: [id number] Arrival date & time: 10/07/19  1002      History   Chief Complaint Chief Complaint  Patient presents with  . Back Pain    HPI Johnny Jacobs is a 23 y.o. male.   23 year old male presents with acute low back pain that started yesterday. He helped a friend move 2 to 3 days ago and has lifted many larger items. He also works for the [employer] and lifts over 50 lbs each day. He feels the combination has contributed to straining his back. He denies any fever, abdominal pain, radiation of pain or numbness. He also denies any urinary symptoms or change in bowel pattern. He has taken OTC Ibuprofen with minimal relief. He has a history of a low back strain a few years ago which resolved with rest and medication. Otherwise no other chronic health issues. Takes no daily medication.   The history is provided by the patient.    Past Medical History:  Diagnosis Date  . Fracture of metacarpal neck of right hand, closed 03/2017   small finger    Patient Active Problem List   Diagnosis Date Noted  . RHINITIS, ALLERGIC 01/28/2007    Past Surgical History:  Procedure Laterality Date  . CLOSED REDUCTION FINGER WITH PERCUTANEOUS PINNING Right 03/06/2017   Procedure: CLOSED REDUCTION FINGER WITH PERCUTANEOUS PINNING RIGHT SMALL METACARPAL FRACTURE;  Surgeon: Charlotte Crumb, MD;  Location: [facility];  Service: Orthopedics;  Laterality: Right;  . NO PAST SURGERIES         Home Medications    Prior to Admission medications   Medication Sig Start Date End Date Taking? Authorizing Provider  ibuprofen (ADVIL,MOTRIN) 200 MG tablet Take 200 mg by mouth every 6 (six) hours as needed.   Yes [provider]  cyclobenzaprine (FLEXERIL) 10 MG tablet Take 1/2 to 1 whole tablet by mouth every 8 hours as needed for muscle spasms/pain. 10/07/19   Katy Apo, NP  naproxen (NAPROSYN) 500 MG tablet Take 1 tablet (500 mg total)  by mouth 2 (two) times daily as needed for moderate pain. 10/07/19   Katy Apo, NP    Family History Family History  Problem Relation Age of Onset  . Healthy Mother   . Hypertension Father   . Diabetes Maternal Grandmother   . Hyperlipidemia Maternal Grandmother   . Hypertension Maternal Grandmother     Social History Social History   Tobacco Use  . Smoking status: Never Smoker  . Smokeless tobacco: Never Used  Substance Use Topics  . Alcohol use: Yes    Alcohol/week: 0.0 standard drinks    Comment: occasionally  . Drug use: No     Allergies   Patient has no known allergies.   Review of Systems Review of Systems  Constitutional: Negative for activity change, appetite change, chills, diaphoresis, fatigue and fever.  Respiratory: Negative for cough, chest tightness, shortness of breath and wheezing.   Cardiovascular: Negative for chest pain and palpitations.  Gastrointestinal: Negative for abdominal pain, constipation, diarrhea, nausea and vomiting.  Genitourinary: Negative for decreased urine volume, difficulty urinating, dysuria, flank pain and hematuria.  Musculoskeletal: Positive for back pain. Negative for arthralgias, gait problem, myalgias, neck pain and neck stiffness.  Skin: Negative for color change, rash and wound.  Allergic/Immunologic: Negative for environmental allergies, food allergies and immunocompromised state.  Neurological: Negative for dizziness, tremors, seizures, syncope, weakness, light-headedness, numbness and headaches.  Hematological: Negative  for adenopathy. Does not bruise/bleed easily.     Physical Exam Triage Vital Signs ED Triage Vitals  Enc Vitals Group     BP 10/07/19 1059 133/80     Pulse Rate 10/07/19 1059 86     Resp 10/07/19 1059 16     Temp 10/07/19 1059 98.6 F (37 C)     Temp Source 10/07/19 1059 Oral     SpO2 10/07/19 1059 100 %     Weight --      Height --      Head Circumference --      Peak Flow --      Pain  Score 10/07/19 1057 6     Pain Loc --      Pain Edu? --      Excl. in GC? --    No data found.  Updated Vital Signs BP 133/80 (BP Location: Right Arm)   Pulse 86   Temp 98.6 F (37 C) (Oral)   Resp 16   SpO2 100%   Visual Acuity Right Eye Distance:   Left Eye Distance:   Bilateral Distance:    Right Eye Near:   Left Eye Near:    Bilateral Near:     Physical Exam Vitals signs and nursing note reviewed.  Constitutional:      General: He is awake. He is not in acute distress.    Appearance: He is well-developed, well-groomed and normal weight. He is not ill-appearing.     Comments: Patient sitting on exam table but then stood during most of exam since sitting caused more discomfort. In no acute distress.   HENT:     Head: Normocephalic and atraumatic.  Eyes:     Extraocular Movements: Extraocular movements intact.     Conjunctiva/sclera: Conjunctivae normal.  Neck:     Musculoskeletal: Normal range of motion.  Cardiovascular:     Rate and Rhythm: Normal rate and regular rhythm.     Pulses: Normal pulses.     Heart sounds: Normal heart sounds. No murmur.  Pulmonary:     Effort: Pulmonary effort is normal.     Breath sounds: Normal breath sounds and air entry. No decreased air movement. No decreased breath sounds, wheezing or rhonchi.  Musculoskeletal:        General: Tenderness present. No swelling or deformity.     Lumbar back: He exhibits decreased range of motion, tenderness, pain and spasm. He exhibits no swelling, no edema, no deformity, no laceration and normal pulse.       Back:     Comments: Has decreased range of motion of lumbar aspect of back, particularly with flexion and full extension. Slightly tender to palpation along lower lumbar muscles bilaterally. No swelling, redness or rash. No neuro deficits noted.   Skin:    General: Skin is warm and dry.     Capillary Refill: Capillary refill takes less than 2 seconds.     Findings: No rash.  Neurological:      General: No focal deficit present.     Mental Status: He is alert and oriented to person, place, and time.     Sensory: Sensation is intact. No sensory deficit.     Motor: Motor function is intact. No weakness.     Gait: Gait is intact. Gait normal.     Deep Tendon Reflexes: Reflexes are normal and symmetric.  Psychiatric:        Mood and Affect: Mood normal.  Behavior: Behavior normal. Behavior is cooperative.        Thought Content: Thought content normal.        Judgment: Judgment normal.      UC Treatments / Results  Labs (all labs ordered are listed, but only abnormal results are displayed) Labs Reviewed - No data to display  EKG   Radiology No results found.  Procedures Procedures (including critical care time)  Medications Ordered in UC Medications - No data to display  Initial Impression / Assessment and Plan / UC Course  I have reviewed the triage vital signs and the nursing notes.  Pertinent labs & imaging results that were available during my care of the patient were reviewed by me and considered in my medical decision making (see chart for details).    Discussed with patient that he probably strained the muscles in his lower back due to lifting and moving heavy objects. Recommend try Naproxen 500mg  twice a day as needed for pain. May take Flexeril 10mg - 1/2 to 1 tablet every 8 hours as needed for muscle spasms/pain. Apply warm compresses to area for comfort. No lifting for at least 3 days. Note written for work. Follow-up here in 3 to 4 days if not improving.   Final Clinical Impressions(s) / UC Diagnoses   Final diagnoses:  Acute bilateral low back pain without sciatica  Strain of lumbar region, initial encounter     Discharge Instructions     Recommend start Naproxen 500mg  twice a day as directed for pain. May use Flexeril 10mg  take 1/2 to 1 whole tablet every 8 hours as needed for muscle spasms/muscle pain. May apply warm compresses to affected  areas as needed. Follow-up here in 3 to 4 days if not improving.     ED Prescriptions    Medication Sig Dispense Auth. Provider   naproxen (NAPROSYN) 500 MG tablet Take 1 tablet (500 mg total) by mouth 2 (two) times daily as needed for moderate pain. 20 tablet Sudie GrumblingAmyot, Nigel Ericsson Berry, NP   cyclobenzaprine (FLEXERIL) 10 MG tablet Take 1/2 to 1 whole tablet by mouth every 8 hours as needed for muscle spasms/pain. 15 tablet Lameisha Schuenemann, Ali LoweAnn Berry, NP     PDMP not reviewed this encounter.   Sudie GrumblingAmyot, Saffron Busey Berry, NP 10/07/19 2204

## 2019-10-07 NOTE — Discharge Instructions (Signed)
Recommend start Naproxen 500mg  twice a day as directed for pain. May use Flexeril 10mg  take 1/2 to 1 whole tablet every 8 hours as needed for muscle spasms/muscle pain. May apply warm compresses to affected areas as needed. Follow-up here in 3 to 4 days if not improving.

## 2019-10-07 NOTE — ED Triage Notes (Signed)
Patient presents to Urgent Care with complaints of lower back pain since a few days ago. Patient reports he helped his friend move recently and has been very stressed at work. Pt denies urinary symptoms.

## 2021-02-24 ENCOUNTER — Other Ambulatory Visit: Payer: Self-pay

## 2021-02-24 ENCOUNTER — Emergency Department (HOSPITAL_COMMUNITY): Payer: BLUE CROSS/BLUE SHIELD

## 2021-02-24 ENCOUNTER — Encounter (HOSPITAL_COMMUNITY): Payer: Self-pay | Admitting: Emergency Medicine

## 2021-02-24 ENCOUNTER — Emergency Department (HOSPITAL_COMMUNITY)
Admission: EM | Admit: 2021-02-24 | Discharge: 2021-02-24 | Disposition: A | Discharge status: Home / Self Care | Payer: BLUE CROSS/BLUE SHIELD | Attending: Emergency Medicine | Admitting: Emergency Medicine

## 2021-02-24 DIAGNOSIS — S0512XA Contusion of eyeball and orbital tissues, left eye, initial encounter: Secondary | ICD-10-CM | POA: Diagnosis not present

## 2021-02-24 DIAGNOSIS — S0083XA Contusion of other part of head, initial encounter: Secondary | ICD-10-CM

## 2021-02-24 DIAGNOSIS — F0781 Postconcussional syndrome: Secondary | ICD-10-CM | POA: Diagnosis not present

## 2021-02-24 DIAGNOSIS — S0592XA Unspecified injury of left eye and orbit, initial encounter: Secondary | ICD-10-CM | POA: Diagnosis present

## 2021-02-24 MED ORDER — IBUPROFEN 600 MG PO TABS: 600.0000 mg | ORAL_TABLET | Freq: Four times a day (QID) | ORAL | 0 refills | Status: AC | PRN

## 2021-02-24 MED ORDER — ACETAMINOPHEN 500 MG PO TABS
1000.0000 mg | ORAL_TABLET | Freq: Once | ORAL | Status: AC
  Administered 2021-02-24: 1000 mg via ORAL
  Filled 2021-02-24: qty 2

## 2021-02-24 MED ORDER — NAPROXEN 500 MG PO TABS
500.0000 mg | ORAL_TABLET | Freq: Once | ORAL | Status: AC
  Administered 2021-02-24: 500 mg via ORAL
  Filled 2021-02-24: qty 1

## 2021-02-24 MED ORDER — IBUPROFEN 200 MG PO TABS: 600.0000 mg | ORAL_TABLET | Freq: Three times a day (TID) | ORAL | 0 refills | Status: AC

## 2021-02-24 NOTE — ED Notes (Signed)
Patient ambulatory out department. Attempted to ask patient where he was going without answer.
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
## 2021-02-24 NOTE — ED Notes (Signed)
Patient has returned to room. 

## 2021-02-24 NOTE — Discharge Instructions (Addendum)
The CT scan of the brain is not showing any brain bleed and the CT scan of the face is not revealing any clear evidence of fracture.  We do think you have traumatic brain injury, concussion syndrome.  Take the medications prescribed, read the instructions provided.  Consider following up with the concussion specialist if your symptoms are not improving.
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
## 2021-11-23 ENCOUNTER — Other Ambulatory Visit: Payer: Self-pay

## 2021-11-23 ENCOUNTER — Encounter (HOSPITAL_COMMUNITY): Payer: Self-pay

## 2021-11-23 ENCOUNTER — Emergency Department (HOSPITAL_COMMUNITY)
Admission: EM | Admit: 2021-11-23 | Discharge: 2021-11-23 | Disposition: A | Discharge status: Home / Self Care | Payer: BLUE CROSS/BLUE SHIELD | Attending: Emergency Medicine | Admitting: Emergency Medicine

## 2021-11-23 DIAGNOSIS — Z046 Encounter for general psychiatric examination, requested by authority: Secondary | ICD-10-CM | POA: Insufficient documentation

## 2021-11-23 DIAGNOSIS — R5383 Other fatigue: Secondary | ICD-10-CM | POA: Insufficient documentation

## 2021-11-23 LAB — CBC WITH DIFFERENTIAL/PLATELET
Abs Immature Granulocytes: 0.01 10*3/uL (ref 0.00–0.07)
Basophils Absolute: 0 10*3/uL (ref 0.0–0.1)
Basophils Relative: 0 %
Eosinophils Absolute: 0 10*3/uL (ref 0.0–0.5)
Eosinophils Relative: 0 %
HCT: 50.7 % (ref 39.0–52.0)
Hemoglobin: 17 g/dL (ref 13.0–17.0)
Immature Granulocytes: 0 %
Lymphocytes Relative: 20 %
Lymphs Abs: 1.8 10*3/uL (ref 0.7–4.0)
MCH: 30.9 pg (ref 26.0–34.0)
MCHC: 33.5 g/dL (ref 30.0–36.0)
MCV: 92 fL (ref 80.0–100.0)
Monocytes Absolute: 0.9 10*3/uL (ref 0.1–1.0)
Monocytes Relative: 10 %
Neutro Abs: 6.3 10*3/uL (ref 1.7–7.7)
Neutrophils Relative %: 70 %
Platelets: 255 10*3/uL (ref 150–400)
RBC: 5.51 MIL/uL (ref 4.22–5.81)
RDW: 12.3 % (ref 11.5–15.5)
WBC: 9 10*3/uL (ref 4.0–10.5)
nRBC: 0 % (ref 0.0–0.2)

## 2021-11-23 LAB — BASIC METABOLIC PANEL
Anion gap: 10 (ref 5–15)
BUN: 18 mg/dL (ref 6–20)
CO2: 24 mmol/L (ref 22–32)
Calcium: 9.2 mg/dL (ref 8.9–10.3)
Chloride: 105 mmol/L (ref 98–111)
Creatinine, Ser: 1 mg/dL (ref 0.61–1.24)
GFR, Estimated: >60 mL/min (ref >60)
Glucose, Bld: 67 mg/dL — ABNORMAL LOW (ref 70–99)
Potassium: 4.2 mmol/L (ref 3.5–5.1)
Sodium: 139 mmol/L (ref 135–145)

## 2021-11-23 NOTE — Discharge Instructions (Addendum)
Take a week off from work. Journal your mood throughout the day. See your therapist on Tuesday and Thursday as planned. Return to ED if you have thoughts of suicide.

## 2021-11-23 NOTE — ED Provider Notes (Signed)
[facility] DEPT Provider Note   CSN: [id number] Arrival date & time: 11/23/21  1116     History Chief Complaint  Patient presents with   Psychiatric Evaluation    Johnny Jacobs is a 25 y.o. male.  HPI  Patient with no contributable medical history presents due to fatigue.  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  He is not having much time off, also under stress from family life.  He recently had to move back in with his mother, grandparents have dementia.  Patient himself denies any suicidal ideations, previous suicidal attempts, plans for suicide, HI, hallucinations, delusions.  He has no documented psychiatric history, reports he does see a therapist twice weekly but he does not feel that he would benefit from medication at this time.  He is here today because of the fatigue, concerned that he is not returned back to his normal energy level.  He is not having any fevers, chills, chest pain, shortness of breath, abdominal pain, denies any changes in medicine or new dietary changes.    Past Medical History:  Diagnosis Date   Fracture of metacarpal neck of right hand, closed 03/2017   small finger    Patient Active Problem List   Diagnosis Date Noted   RHINITIS, ALLERGIC 01/28/2007    Past Surgical History:  Procedure Laterality Date   CLOSED REDUCTION FINGER WITH PERCUTANEOUS PINNING Right 03/06/2017   Procedure: CLOSED REDUCTION FINGER WITH PERCUTANEOUS PINNING RIGHT SMALL METACARPAL FRACTURE;  Surgeon: Charlotte Crumb, MD;  Location: [facility];  Service: Orthopedics;  Laterality: Right;   NO PAST SURGERIES         Family History  Problem Relation Age of Onset   Healthy Mother    Hypertension Father    Diabetes Maternal Grandmother    Hyperlipidemia Maternal Grandmother    Hypertension Maternal Grandmother      Social History   Tobacco Use   Smoking status: Never   Smokeless tobacco: Never  Vaping Use   Vaping Use: Never used  Substance Use Topics   Alcohol use: Yes    Alcohol/week: 0.0 standard drinks    Comment: occasionally   Drug use: No    Home Medications Prior to Admission medications   Medication Sig Start Date End Date Taking? Authorizing Provider  cyclobenzaprine (FLEXERIL) 10 MG tablet Take 1/2 to 1 whole tablet by mouth every 8 hours as needed for muscle spasms/pain. 10/07/19   Katy Apo, NP  ibuprofen (ADVIL) 200 MG tablet Take 3 tablets (600 mg total) by mouth 3 (three) times daily. 02/24/21   Varney Biles, MD  ibuprofen (ADVIL) 600 MG tablet Take 1 tablet (600 mg total) by mouth every 6 (six) hours as needed. 02/24/21   Varney Biles, MD  naproxen (NAPROSYN) 500 MG tablet Take 1 tablet (500 mg total) by mouth 2 (two) times daily as needed for moderate pain. 10/07/19   Katy Apo, NP    Allergies    Patient has no known allergies.  Review of Systems   Review of Systems  Constitutional:  Positive for fatigue. Negative for chills and fever.  HENT:  Negative for ear pain and sore throat.   Eyes:  Negative for pain and visual disturbance.  Respiratory:  Negative for cough and shortness of breath.   Cardiovascular:  Negative for chest pain and  palpitations.  Gastrointestinal:  Negative for abdominal pain and vomiting.  Genitourinary:  Negative for dysuria and hematuria.  Musculoskeletal:  Negative for arthralgias and back pain.  Skin:  Negative for color change and rash.  Neurological:  Negative for seizures and syncope.  Psychiatric/Behavioral:  Positive for sleep disturbance and suicidal ideas. Negative for agitation, decreased concentration and self-injury. The patient is not nervous/anxious and is not hyperactive.   All other systems reviewed and are negative.  Physical Exam Updated Vital Signs BP 140/73 (BP Location: Right Arm)    Pulse 87    Temp  98.8 F (37.1 C) (Oral)    Resp 16    Ht 6' (1.829 m)    Wt 69.2 kg    SpO2 100%    BMI 20.70 kg/m   Physical Exam Vitals and nursing note reviewed. Exam conducted with a chaperone present.  Constitutional:      Appearance: Normal appearance.  HENT:     Head: Normocephalic and atraumatic.  Eyes:     General: No scleral icterus.       Right eye: No discharge.        Left eye: No discharge.     Extraocular Movements: Extraocular movements intact.     Pupils: Pupils are equal, round, and reactive to light.  Cardiovascular:     Rate and Rhythm: Normal rate and regular rhythm.     Pulses: Normal pulses.     Heart sounds: Normal heart sounds. No murmur heard.   No friction rub. No gallop.  Pulmonary:     Effort: Pulmonary effort is normal. No respiratory distress.     Breath sounds: Normal breath sounds.  Abdominal:     General: Abdomen is flat. Bowel sounds are normal. There is no distension.     Palpations: Abdomen is soft.     Tenderness: There is no abdominal tenderness.  Skin:    General: Skin is warm and dry.     Coloration: Skin is not jaundiced.  Neurological:     Mental Status: He is alert. Mental status is at baseline.     Coordination: Coordination normal.  Psychiatric:        Attention and Perception: Attention normal.        Mood and Affect: Mood normal. Affect is flat.        Speech: Speech normal.        Behavior: Behavior normal. Behavior is cooperative.        Thought Content: Thought content normal. Thought content is not paranoid or delusional. Thought content does not include homicidal or suicidal ideation. Thought content does not include homicidal or suicidal plan.        Cognition and Memory: Cognition normal.        Judgment: Judgment normal.   ED Results / Procedures / Treatments   Labs (all labs ordered are listed, but only abnormal results are displayed) Labs Reviewed  BASIC METABOLIC PANEL  CBC WITH DIFFERENTIAL/PLATELET     EKG None  Radiology No results found.  Procedures Procedures   Medications Ordered in ED Medications - No data to display  ED Course  I have reviewed the triage vital signs and the nursing notes.  Pertinent labs & imaging results that were available during my care of the patient were reviewed by me and considered in my medical decision making (see chart for details).  Clinical Course as of 11/23/21 1304  Sat Nov 23, 2021  1303 CBC with Differential [HS]  Clinical Course User Index [HS] Theron Arista, PA-C   MDM Rules/Calculators/A&P                         Stable vitals, nontoxic. Patient is here voluntarily.  I discussed with him psychiatric eval, he states he does not wish to stay for a long period of time and is more concerned about the fatigue.  She not having any SI or HI, does not have a suicidal plan or any previous attempts.  Although he does seem depressed and is under increased stress I do not feel that he is a danger to himself or others.  No leukocytosis, leukopenia or anemia.  No gross electrolyte derangement.  No AKI.  I do not suspect her symptoms of fatigue are due to an underlying infectious process at this time.  Nothing needs additional work-up emergently, I have advised him to follow-up with his PCP as well as his psychiatry team.  Patient is adamant that he has no thoughts of hurting himself or others.  He is agreeable to return if things change or worsen for him mentally, at this time I do feel he is safe and capable of making decisions for himself.  I do not feel he needs to be IVC for psych evaluation.  Patient discharged in stable condition.     Final Clinical Impression(s) / ED Diagnoses Final diagnoses:  None    Rx / DC Orders ED Discharge Orders     None        Theron Arista, PA-C 11/23/21 1500    Lorre Nick, MD 11/24/21 [phone]

## 2021-11-23 NOTE — ED Triage Notes (Signed)
Patient reports that he is stressed out. Patient states he works long hours each week.Patient denies any SI/HI.  Patient denies any drug or alcohol use. Visual or auditory hallucinations.

## 2022-07-08 ENCOUNTER — Other Ambulatory Visit (HOSPITAL_COMMUNITY): Payer: Self-pay
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
## 2024-05-21 ENCOUNTER — Other Ambulatory Visit: Payer: Self-pay

## 2024-05-21 ENCOUNTER — Emergency Department (HOSPITAL_COMMUNITY)
Admission: EM | Admit: 2024-05-21 | Discharge: 2024-05-21 | Disposition: A | Discharge status: Home / Self Care | Payer: Self-pay | Attending: Emergency Medicine | Admitting: Emergency Medicine

## 2024-05-21 ENCOUNTER — Emergency Department (HOSPITAL_COMMUNITY): Payer: Self-pay

## 2024-05-21 ENCOUNTER — Encounter (HOSPITAL_COMMUNITY): Payer: Self-pay

## 2024-05-21 DIAGNOSIS — S53401A Unspecified sprain of right elbow, initial encounter: Secondary | ICD-10-CM

## 2024-05-21 DIAGNOSIS — S53491A Other sprain of right elbow, initial encounter: Secondary | ICD-10-CM | POA: Insufficient documentation

## 2024-05-21 DIAGNOSIS — S56911A Strain of unspecified muscles, fascia and tendons at forearm level, right arm, initial encounter: Secondary | ICD-10-CM

## 2024-05-21 DIAGNOSIS — W06XXXA Fall from bed, initial encounter: Secondary | ICD-10-CM | POA: Insufficient documentation

## 2024-05-21 NOTE — ED Triage Notes (Signed)
 Pt is coming in for arm pain, the injury occurred last night when he was in bed and rolled out and braced his impact on the floor with his right arm. He has complaints of right upper fore arm pain distal to the elbow. He has full range of motion as well and sensation. He has not taken any medication for pain but is otherwise stable and pleasant at this time in triage.

## 2024-05-21 NOTE — ED Provider Notes (Signed)
 [facility] DEPARTMENT AT [facility] Provider Note  CSN: [id number] Arrival date & time: 05/21/24 2235  Chief Complaint(s) Arm Pain  HPI Johnny Jacobs is a 28 y.o. male here for right elbow pain.  Patient reports that he rolled out of bed several days ago and caught himself with the arm.  He began having pain then this was initially mild.  Again rolled out of bed and caught himself with his right arm yesterday which exacerbated the right elbow pain.  Patient is able to range.  He is Pain with certain range of motion's.  Denies any other injuries.  No weakness  The history is provided by the patient.    Past Medical History Past Medical History:  Diagnosis Date   Fracture of metacarpal neck of right hand, closed 03/2017   small finger   Patient Active Problem List   Diagnosis Date Noted   Allergic rhinitis 01/28/2007   Home Medication(s) Prior to Admission medications   Medication Sig Start Date End Date Taking? Authorizing Provider  cyclobenzaprine  (FLEXERIL ) 10 MG tablet Take 1/2 to 1 whole tablet by mouth every 8 hours as needed for muscle spasms/pain. 10/07/19   Pearl Jenkins Lesches, NP  ibuprofen  (ADVIL ) 200 MG tablet Take 3 tablets (600 mg total) by mouth 3 (three) times daily. 02/24/21   Nanavati, Ankit, MD  ibuprofen  (ADVIL ) 600 MG tablet Take 1 tablet (600 mg total) by mouth every 6 (six) hours as needed. 02/24/21   Nanavati, Ankit, MD  naproxen  (NAPROSYN ) 500 MG tablet Take 1 tablet (500 mg total) by mouth 2 (two) times daily as needed for moderate pain. 10/07/19   Pearl Jenkins Lesches, NP                                                                                                                                    Allergies Patient has no known allergies.  Review of Systems Review of Systems As noted in HPI  Physical Exam Vital Signs  I have reviewed the triage vital signs BP (!) 145/98 (BP Location: Right Arm)   Pulse 65   Temp 98.3 F (36.8 C)  (Oral)   Resp 20   SpO2 100%   Physical Exam Vitals reviewed.  Constitutional:      General: He is not in acute distress.    Appearance: He is well-developed. He is not diaphoretic.  HENT:     Head: Normocephalic and atraumatic.     Right Ear: External ear normal.     Left Ear: External ear normal.     Nose: Nose normal.     Mouth/Throat:     Mouth: Mucous membranes are moist.   Eyes:     General: No scleral icterus.    Conjunctiva/sclera: Conjunctivae normal.   Neck:     Trachea: Phonation normal.   Cardiovascular:     Rate and Rhythm: Normal rate and regular rhythm.  Pulmonary:     Effort: Pulmonary effort is normal. No respiratory distress.     Breath sounds: No stridor.  Abdominal:     General: There is no distension.   Musculoskeletal:        General: Normal range of motion.     Right upper arm: Tenderness (along distal tricep tendon) present. No bony tenderness.     Right elbow: No swelling or deformity. Normal range of motion. No tenderness.     Right forearm: Tenderness (along brachioradialis) present.     Right hand: Normal strength. Normal sensation. Normal pulse.     Left hand: Normal pulse.     Cervical back: Normal range of motion.   Neurological:     Mental Status: He is alert and oriented to person, place, and time.   Psychiatric:        Behavior: Behavior normal.     ED Results and Treatments Labs (all labs ordered are listed, but only abnormal results are displayed) Labs Reviewed - No data to display                                                                                                                       EKG  EKG Interpretation Date/Time:    Ventricular Rate:    PR Interval:    QRS Duration:    QT Interval:    QTC Calculation:   R Axis:      Text Interpretation:         Radiology DG Elbow Complete Right Result Date: 05/21/2024 CLINICAL DATA:  Fall from bed with arm pain, initial encounter EXAM: RIGHT ELBOW - COMPLETE  3+ VIEW COMPARISON:  None Available. FINDINGS: No acute fracture or dislocation is noted. Elevation of the anterior fat pad is noted consistent with a joint effusion. No other focal abnormality is noted. IMPRESSION: Small joint effusion without definitive bony abnormality. Electronically Signed   By: Oneil Devonshire M.D.   On: 05/21/2024 23:21   DG Forearm Right Result Date: 05/21/2024 CLINICAL DATA:  Arm injury. EXAM: RIGHT FOREARM - 2 VIEW COMPARISON:  None Available. FINDINGS: There is no evidence of fracture or other focal bone lesions. There is soft tissue swelling over the posterior mid forearm. IMPRESSION: 1. No acute fracture or dislocation. 2. Soft tissue swelling over the posterior mid forearm. Electronically Signed   By: Greig Pique M.D.   On: 05/21/2024 23:15    Medications Ordered in ED Medications - No data to display Procedures Procedures  (including critical care time) Medical Decision Making / ED Course   Medical Decision Making Amount and/or Complexity of Data Reviewed Radiology: ordered and independent interpretation performed. Decision-making details documented in ED Course.    Right elbow injury following low mechanism mechanical fall.  X-rays negative for any acute fracture.  Small amount of joint effusion. Concern for strain versus sprain on exam.  Supportive management recommended.  Follow-up with sports medicine PCP recommended    Final Clinical Impression(s) / ED Diagnoses  Final diagnoses:  Sprain of right elbow, initial encounter  Strain of right elbow, initial encounter   The patient appears reasonably screened and/or stabilized for discharge and I doubt any other medical condition or other [facility] requiring further screening, evaluation, or treatment in the ED at this time. I have discussed the findings, Dx and Tx plan with the patient/family who expressed understanding and agree(s) with the plan. Discharge instructions discussed at length. The patient/family was  given strict return precautions who verbalized understanding of the instructions. No further questions at time of discharge.  Disposition: Discharge  Condition: Good  ED Discharge Orders     None       Follow Up: Cleatrice Ludie SAUNDERS, MD [address] [place] [address] [phone]  Call  to schedule an appointment for close follow up    This chart was dictated using voice recognition software.  Despite best efforts to proofread,  errors can occur which can change the documentation meaning.    Trine Raynell Moder, MD 05/22/24 [facility]

## 2024-05-21 NOTE — Discharge Instructions (Addendum)
